# Patient Record
Sex: Male | Born: 1965 | Race: White | Hispanic: No | Marital: Married | Smoking: Never smoker
Health system: Southern US, Academic
[De-identification: ages and names within clinical notes are randomized; demographics above are authoritative.]

## PROBLEM LIST (undated history)

## (undated) DIAGNOSIS — I10 Essential (primary) hypertension: Secondary | ICD-10-CM

## (undated) DIAGNOSIS — K219 Gastro-esophageal reflux disease without esophagitis: Secondary | ICD-10-CM

## (undated) HISTORY — PX: HX GALL BLADDER SURGERY/CHOLE: SHX55

## (undated) HISTORY — DX: Gastro-esophageal reflux disease without esophagitis: K21.9

## (undated) HISTORY — DX: Essential (primary) hypertension: I10

---

## 2004-07-25 ENCOUNTER — Other Ambulatory Visit (HOSPITAL_COMMUNITY): Payer: Self-pay | Admitting: Internal Medicine

## 2021-08-02 ENCOUNTER — Other Ambulatory Visit (HOSPITAL_COMMUNITY): Payer: Self-pay | Admitting: Family Medicine

## 2021-08-02 DIAGNOSIS — R110 Nausea: Secondary | ICD-10-CM

## 2021-08-02 DIAGNOSIS — R109 Unspecified abdominal pain: Secondary | ICD-10-CM

## 2021-08-02 DIAGNOSIS — R1110 Vomiting, unspecified: Secondary | ICD-10-CM

## 2021-08-03 ENCOUNTER — Inpatient Hospital Stay
Admission: RE | Admit: 2021-08-03 | Discharge: 2021-08-03 | Disposition: A | Discharge status: Home / Self Care | Payer: 59 | Source: Ambulatory Visit | Attending: Family Medicine | Admitting: Family Medicine

## 2021-08-03 ENCOUNTER — Other Ambulatory Visit: Payer: Self-pay

## 2021-08-03 ENCOUNTER — Inpatient Hospital Stay (HOSPITAL_COMMUNITY)
Admission: RE | Admit: 2021-08-03 | Discharge: 2021-08-03 | Disposition: A | Discharge status: Home / Self Care | Payer: 59 | Source: Ambulatory Visit

## 2021-08-03 DIAGNOSIS — R112 Nausea with vomiting, unspecified: Secondary | ICD-10-CM

## 2021-08-03 DIAGNOSIS — R109 Unspecified abdominal pain: Secondary | ICD-10-CM

## 2021-08-03 DIAGNOSIS — R110 Nausea: Secondary | ICD-10-CM

## 2021-08-03 DIAGNOSIS — R1110 Vomiting, unspecified: Secondary | ICD-10-CM

## 2021-08-03 MED ORDER — SINCALIDE 5 MCG SOLUTION FOR INJECTION
INTRAMUSCULAR | Status: AC
Start: 2021-08-03 — End: 2021-08-03
  Filled 2021-08-03: qty 5

## 2021-08-03 MED ORDER — SINCALIDE 5 MCG SOLUTION FOR INJECTION
1.7000 ug | INTRAMUSCULAR | Status: DC
Start: 2021-08-03 — End: 2021-08-04

## 2021-08-09 ENCOUNTER — Ambulatory Visit (INDEPENDENT_AMBULATORY_CARE_PROVIDER_SITE_OTHER): Payer: 59 | Admitting: Surgery

## 2021-08-09 ENCOUNTER — Inpatient Hospital Stay
Admission: RE | Admit: 2021-08-09 | Discharge: 2021-08-09 | Disposition: A | Discharge status: Home / Self Care | Payer: 59 | Source: Ambulatory Visit | Attending: Surgery | Admitting: Surgery

## 2021-08-09 ENCOUNTER — Other Ambulatory Visit (INDEPENDENT_AMBULATORY_CARE_PROVIDER_SITE_OTHER): Payer: Self-pay | Admitting: Surgery

## 2021-08-09 ENCOUNTER — Encounter (INDEPENDENT_AMBULATORY_CARE_PROVIDER_SITE_OTHER): Payer: Self-pay | Admitting: Surgery

## 2021-08-09 ENCOUNTER — Other Ambulatory Visit: Payer: Self-pay

## 2021-08-09 ENCOUNTER — Other Ambulatory Visit (HOSPITAL_COMMUNITY): Payer: 59

## 2021-08-09 VITALS — BP 107/67 | HR 58 | Temp 98.2°F | Resp 18 | Ht 68.0 in | Wt 191.0 lb

## 2021-08-09 DIAGNOSIS — Z01818 Encounter for other preprocedural examination: Secondary | ICD-10-CM

## 2021-08-09 DIAGNOSIS — K811 Chronic cholecystitis: Secondary | ICD-10-CM

## 2021-08-09 DIAGNOSIS — K8020 Calculus of gallbladder without cholecystitis without obstruction: Secondary | ICD-10-CM

## 2021-08-09 DIAGNOSIS — R1013 Epigastric pain: Secondary | ICD-10-CM

## 2021-08-09 DIAGNOSIS — K828 Other specified diseases of gallbladder: Secondary | ICD-10-CM

## 2021-08-09 LAB — BASIC METABOLIC PANEL
ANION GAP: 4 mmol/L — ABNORMAL LOW (ref 10–20)
BUN/CREA RATIO: 11 (ref 6–22)
BUN: 11 mg/dL (ref 7–25)
CALCIUM: 9.4 mg/dL (ref 8.6–10.3)
CHLORIDE: 103 mmol/L (ref 98–107)
CO2 TOTAL: 32 mmol/L — ABNORMAL HIGH (ref 21–31)
CREATININE: 0.99 mg/dL (ref 0.60–1.30)
ESTIMATED GFR: 89 mL/min/{1.73_m2} (ref >59)
GLUCOSE: 82 mg/dL (ref 74–109)
OSMOLALITY, CALCULATED: 276 mOsm/kg (ref 270–290)
POTASSIUM: 4.1 mmol/L (ref 3.5–5.1)
SODIUM: 139 mmol/L (ref 136–145)

## 2021-08-09 LAB — CBC WITH DIFF
BASOPHIL #: 0 10*3/uL (ref 0.00–0.30)
BASOPHIL %: 1 % (ref 0–3)
EOSINOPHIL #: 0.2 10*3/uL (ref 0.00–0.80)
EOSINOPHIL %: 5 % (ref 0–7)
HCT: 40.1 % — ABNORMAL LOW (ref 42.0–51.0)
HGB: 13.7 g/dL (ref 13.5–18.0)
LYMPHOCYTE #: 1.3 10*3/uL (ref 1.10–5.00)
LYMPHOCYTE %: 31 % (ref 25–45)
MCH: 28.4 pg (ref 27.0–32.0)
MCHC: 34.3 g/dL (ref 32.0–36.0)
MCV: 83 fL (ref 78.0–99.0)
MONOCYTE #: 0.4 10*3/uL (ref 0.00–1.30)
MONOCYTE %: 10 % (ref 0–12)
MPV: 6.9 fL — ABNORMAL LOW (ref 7.4–10.4)
NEUTROPHIL #: 2.1 10*3/uL (ref 1.80–8.40)
NEUTROPHIL %: 52 % (ref 40–76)
PLATELETS: 197 10*3/uL (ref 140–440)
RBC: 4.83 10*6/uL (ref 4.20–6.00)
RDW: 14 % (ref 11.6–14.8)
WBC: 4.1 10*3/uL (ref 4.0–10.5)
WBCS UNCORRECTED: 4.1 10*3/uL

## 2021-08-10 LAB — ECG 12 LEAD
Atrial Rate: 52 {beats}/min
Calculated P Axis: 69 degrees
Calculated R Axis: 25 degrees
Calculated T Axis: 54 degrees
PR Interval: 152 ms
QRS Duration: 98 ms
QT Interval: 412 ms
QTC Calculation: 383 ms
Ventricular rate: 52 {beats}/min

## 2021-08-11 ENCOUNTER — Ambulatory Visit (HOSPITAL_COMMUNITY): Payer: 59 | Admitting: Anesthesiology

## 2021-08-11 ENCOUNTER — Encounter (HOSPITAL_COMMUNITY)
Admission: RE | Disposition: A | Discharge status: Home / Self Care | Payer: Self-pay | Source: Ambulatory Visit | Attending: Surgery

## 2021-08-11 ENCOUNTER — Encounter (HOSPITAL_COMMUNITY): Payer: Self-pay | Admitting: Surgery

## 2021-08-11 ENCOUNTER — Encounter (INDEPENDENT_AMBULATORY_CARE_PROVIDER_SITE_OTHER): Payer: Self-pay | Admitting: Surgery

## 2021-08-11 ENCOUNTER — Other Ambulatory Visit: Payer: Self-pay

## 2021-08-11 ENCOUNTER — Inpatient Hospital Stay
Admission: RE | Admit: 2021-08-11 | Discharge: 2021-08-11 | Disposition: A | Discharge status: Home / Self Care | Payer: 59 | Source: Ambulatory Visit | Attending: Surgery | Admitting: Surgery

## 2021-08-11 ENCOUNTER — Encounter (HOSPITAL_COMMUNITY): Payer: 59 | Admitting: Surgery

## 2021-08-11 ENCOUNTER — Other Ambulatory Visit (HOSPITAL_COMMUNITY): Payer: 59

## 2021-08-11 DIAGNOSIS — K828 Other specified diseases of gallbladder: Secondary | ICD-10-CM | POA: Insufficient documentation

## 2021-08-11 DIAGNOSIS — K811 Chronic cholecystitis: Secondary | ICD-10-CM | POA: Insufficient documentation

## 2021-08-11 SURGERY — CHOLECYSTECTOMY LAPAROSCOPIC
Anesthesia: General | Site: Abdomen | Wound class: Clean Wound: Uninfected operative wounds in which no inflammation occurred

## 2021-08-11 MED ORDER — MIDAZOLAM 5 MG/ML INJECTION WRAPPER
2.0000 mg | Freq: Once | INTRAMUSCULAR | Status: DC | PRN
Start: 2021-08-11 — End: 2021-08-11
  Administered 2021-08-11: 2 mg via INTRAVENOUS

## 2021-08-11 MED ORDER — ONDANSETRON HCL (PF) 4 MG/2 ML INJECTION SOLUTION
4.0000 mg | Freq: Once | INTRAMUSCULAR | Status: DC | PRN
Start: 2021-08-11 — End: 2021-08-11

## 2021-08-11 MED ORDER — IPRATROPIUM 0.5 MG-ALBUTEROL 3 MG (2.5 MG BASE)/3 ML NEBULIZATION SOLN
3.0000 mL | INHALATION_SOLUTION | Freq: Once | RESPIRATORY_TRACT | Status: DC | PRN
Start: 2021-08-11 — End: 2021-08-11

## 2021-08-11 MED ORDER — ONDANSETRON HCL (PF) 4 MG/2 ML INJECTION SOLUTION
4.0000 mg | Freq: Once | INTRAMUSCULAR | Status: AC
Start: 2021-08-11 — End: 2021-08-11
  Administered 2021-08-11: 4 mg via INTRAVENOUS

## 2021-08-11 MED ORDER — SODIUM CHLORIDE 0.9 % (FLUSH) INJECTION SYRINGE
3.0000 mL | INJECTION | INTRAMUSCULAR | Status: DC | PRN
Start: 2021-08-11 — End: 2021-08-11

## 2021-08-11 MED ORDER — ROCURONIUM 10 MG/ML INTRAVENOUS SYRINGE WRAPPER
INJECTION | Freq: Once | INTRAVENOUS | Status: DC | PRN
Start: 2021-08-11 — End: 2021-08-11
  Administered 2021-08-11: 40 mg via INTRAVENOUS

## 2021-08-11 MED ORDER — FENTANYL (PF) 50 MCG/ML INJECTION SOLUTION
INTRAMUSCULAR | Status: AC
Start: 2021-08-11 — End: 2021-08-11
  Filled 2021-08-11: qty 2

## 2021-08-11 MED ORDER — LACTATED RINGERS INTRAVENOUS SOLUTION
INTRAVENOUS | Status: DC
Start: 2021-08-11 — End: 2021-08-11

## 2021-08-11 MED ORDER — LACTATED RINGERS INTRAVENOUS SOLUTION
INTRAVENOUS | Status: DC | PRN
Start: 2021-08-11 — End: 2021-08-11
  Administered 2021-08-11: 0 via INTRAVENOUS

## 2021-08-11 MED ORDER — ONDANSETRON HCL (PF) 4 MG/2 ML INJECTION SOLUTION
INTRAMUSCULAR | Status: AC
Start: 2021-08-11 — End: 2021-08-11
  Filled 2021-08-11: qty 2

## 2021-08-11 MED ORDER — KETOROLAC 30 MG/ML (1 ML) INJECTION SOLUTION
15.0000 mg | Freq: Four times a day (QID) | INTRAMUSCULAR | Status: DC | PRN
Start: 2021-08-11 — End: 2021-08-11

## 2021-08-11 MED ORDER — PHENYLEPHRINE 100 MCG/ML IV DILUTION - FOR ANES
INJECTION | Freq: Once | INTRAVENOUS | Status: DC | PRN
  Administered 2021-08-11 (×4): 100 ug via INTRAVENOUS

## 2021-08-11 MED ORDER — PROCHLORPERAZINE EDISYLATE 10 MG/2 ML (5 MG/ML) INJECTION SOLUTION
5.0000 mg | Freq: Once | INTRAMUSCULAR | Status: DC | PRN
Start: 2021-08-11 — End: 2021-08-11

## 2021-08-11 MED ORDER — FAMOTIDINE (PF) 20 MG/2 ML INTRAVENOUS SOLUTION
INTRAVENOUS | Status: AC
Start: 2021-08-11 — End: 2021-08-11
  Filled 2021-08-11: qty 2

## 2021-08-11 MED ORDER — SODIUM CHLORIDE 0.9 % (FLUSH) INJECTION SYRINGE
3.0000 mL | INJECTION | Freq: Three times a day (TID) | INTRAMUSCULAR | Status: DC
Start: 2021-08-11 — End: 2021-08-11

## 2021-08-11 MED ORDER — SUGAMMADEX 100 MG/ML INTRAVENOUS SOLUTION
Freq: Once | INTRAVENOUS | Status: DC | PRN
Start: 2021-08-11 — End: 2021-08-11
  Administered 2021-08-11: 300 mg via INTRAVENOUS

## 2021-08-11 MED ORDER — DEXAMETHASONE SODIUM PHOSPHATE 4 MG/ML INJECTION SOLUTION
INTRAMUSCULAR | Status: AC
Start: 2021-08-11 — End: 2021-08-11
  Filled 2021-08-11: qty 1

## 2021-08-11 MED ORDER — OXYCODONE-ACETAMINOPHEN 5 MG-325 MG TABLET
1.0000 | ORAL_TABLET | ORAL | Status: DC | PRN
Start: 2021-08-11 — End: 2021-08-11

## 2021-08-11 MED ORDER — FENTANYL (PF) 50 MCG/ML INJECTION WRAPPER
25.0000 ug | INJECTION | INTRAMUSCULAR | Status: DC | PRN
Start: 2021-08-11 — End: 2021-08-11

## 2021-08-11 MED ORDER — FAMOTIDINE (PF) 20 MG/2 ML INTRAVENOUS SOLUTION
20.0000 mg | Freq: Once | INTRAVENOUS | Status: AC
Start: 2021-08-11 — End: 2021-08-11
  Administered 2021-08-11: 20 mg via INTRAVENOUS

## 2021-08-11 MED ORDER — EPHEDRINE SULFATE 50 MG/ML INTRAVENOUS SOLUTION
Freq: Once | INTRAVENOUS | Status: DC | PRN
Start: 2021-08-11 — End: 2021-08-11
  Administered 2021-08-11: 20 mg via INTRAVENOUS
  Administered 2021-08-11 (×2): 10 mg via INTRAVENOUS

## 2021-08-11 MED ORDER — HYDROCODONE 5 MG-ACETAMINOPHEN 325 MG TABLET
1.0000 | ORAL_TABLET | ORAL | 0 refills | Status: AC | PRN
Start: 2021-08-11 — End: 2021-08-15

## 2021-08-11 MED ORDER — MORPHINE 4 MG/ML INJECTION WRAPPER
4.0000 mg | INJECTION | INTRAMUSCULAR | Status: DC | PRN
Start: 2021-08-11 — End: 2021-08-11

## 2021-08-11 MED ORDER — ALBUTEROL SULFATE 2.5 MG/3 ML (0.083 %) SOLUTION FOR NEBULIZATION
2.5000 mg | INHALATION_SOLUTION | Freq: Once | RESPIRATORY_TRACT | Status: DC | PRN
Start: 2021-08-11 — End: 2021-08-11

## 2021-08-11 MED ORDER — LIDOCAINE (PF) 100 MG/5 ML (2 %) INTRAVENOUS SYRINGE
INJECTION | Freq: Once | INTRAVENOUS | Status: DC | PRN
Start: 2021-08-11 — End: 2021-08-11
  Administered 2021-08-11: 100 mg via INTRAVENOUS

## 2021-08-11 MED ORDER — IBUPROFEN 400 MG TABLET
400.0000 mg | ORAL_TABLET | Freq: Four times a day (QID) | ORAL | Status: DC | PRN
Start: 2021-08-11 — End: 2021-08-11

## 2021-08-11 MED ORDER — PROPOFOL 10 MG/ML IV BOLUS
INJECTION | Freq: Once | INTRAVENOUS | Status: DC | PRN
Start: 2021-08-11 — End: 2021-08-11
  Administered 2021-08-11: 180 mg via INTRAVENOUS

## 2021-08-11 MED ORDER — DEXAMETHASONE SODIUM PHOSPHATE 4 MG/ML INJECTION SOLUTION
4.0000 mg | Freq: Once | INTRAMUSCULAR | Status: AC
Start: 2021-08-11 — End: 2021-08-11
  Administered 2021-08-11: 4 mg via INTRAVENOUS

## 2021-08-11 MED ORDER — ROPIVACAINE (PF) 2 MG/ML (0.2 %) INJECTION SOLUTION
Freq: Once | INTRAMUSCULAR | Status: DC | PRN
Start: 2021-08-11 — End: 2021-08-11
  Administered 2021-08-11: 20 mL via INTRAMUSCULAR

## 2021-08-11 MED ORDER — FENTANYL (PF) 50 MCG/ML INJECTION WRAPPER
INJECTION | Freq: Once | INTRAMUSCULAR | Status: DC | PRN
Start: 2021-08-11 — End: 2021-08-11
  Administered 2021-08-11 (×2): 50 ug via INTRAVENOUS

## 2021-08-11 MED ORDER — FENTANYL (PF) 50 MCG/ML INJECTION WRAPPER
50.0000 ug | INJECTION | INTRAMUSCULAR | Status: AC | PRN
Start: 2021-08-11 — End: 2021-08-11
  Administered 2021-08-11 (×4): 50 ug via INTRAVENOUS

## 2021-08-11 MED ORDER — MIDAZOLAM 5 MG/ML INJECTION WRAPPER
INTRAMUSCULAR | Status: AC
Start: 2021-08-11 — End: 2021-08-11
  Filled 2021-08-11: qty 1

## 2021-08-11 MED ORDER — ONDANSETRON HCL (PF) 4 MG/2 ML INJECTION SOLUTION
4.0000 mg | Freq: Four times a day (QID) | INTRAMUSCULAR | Status: DC | PRN
Start: 2021-08-11 — End: 2021-08-11

## 2021-08-11 MED ORDER — ROPIVACAINE (PF) 2 MG/ML (0.2 %) INJECTION SOLUTION
INTRAMUSCULAR | Status: AC
Start: 2021-08-11 — End: 2021-08-11
  Filled 2021-08-11: qty 10

## 2021-08-11 SURGICAL SUPPLY — 84 items
ADH LIQUID LF  WTPRF VIAL PREP NONSTAIN MASTISOL STYRAX GUM MASTIC ALC MTHY SLCYT STRL CLR NHZR 2/3 (WOUND CARE SUPPLY) ×1 IMPLANT
ADH LQ LF VIAL AMP PREP MASTI_SOL STYRAX GUM MASTIC ALC MTHY (WOUND CARE/ENTEROSTOMAL SUPPLY) ×1
APPLIER E-CLP III SUP INTLK 33CM 5MM PSTL GRIP GLARE RST SAF INTLK HNDL 16 CLIP TI MED LRG INTERNAL (WOUND CARE SUPPLY) ×1 IMPLANT
APPLIER E-CLP III SUP INTLK 33_CM 5MM PSTL GRIP GLARE RST SAF (WOUND CARE/ENTEROSTOMAL SUPPLY) ×1
APPLIER E-CLP2 SUP INTLK 29CM 10MM PSTL GRIP GLARE RST SAF INTLK HNDL 20 ML CLIP TI INTERNAL CLIP (WOUND CARE SUPPLY) ×1 IMPLANT
APPLIER E-CLP2 SUP INTLK 29CM_10MM PSTL GRIP GLARE RST SAF (WOUND CARE/ENTEROSTOMAL SUPPLY) ×1
BAG BIOHAZ RD 30X24IN THK3 MIL C8-10GL LLDPE INFCT WASTE CAN (MED SURG SUPPLIES) ×2
BAG BIOHAZ RD 30X24IN THK3 MIL C8-10GL LLDPE INFCT WASTE CAN PNCT RST (MED SURG SUPPLIES) ×2
BAG BIOHAZ RD 43X30IN THK3 MIL C20-30GL LLDPE INFCT WASTE (MED SURG SUPPLIES) ×1
BAG BIOHAZ RD 43X30IN THK3 MIL C20-30GL LLDPE INFCT WASTE CAN PNCT RST (MED SURG SUPPLIES) ×1 IMPLANT
BLADE 15 2 END CBNSTL SURG STRL DISP (CUTTING ELEMENTS) ×1
BLADE 15 2 END CBNSTL SURG STRL DISP (SURGICAL CUTTING SUPPLIES) ×1 IMPLANT
CATH CHOLANG REDDICK 4FR SCP TIP STRL LTX DISP (VASCULAR) ×1 IMPLANT
CATH CHOLGM SMTR REDDICK 4FR 5_0CM SCP TIP STF TUBE DISP (VASCULAR) ×1
CLEANER INSTR PREPZYME MUL-TRD CONTAINR NARSL NEUT PH BDGR (MISCELLANEOUS PT CARE ITEMS) ×1
CONTAINR HISTO C90ML 10% NEUT BF FRMLN POLYPROP PREFL 60ML (MISCELLANEOUS PT CARE ITEMS) ×2 IMPLANT
CONV USE 102436 - NEEDLE HYPO  22GA 1.5IN STD MONOJECT SS POLYPROP REG BVL LL HUB UL SHRP ANTICORE BLU STRL LF  DISP (MED SURG SUPPLIES) ×2 IMPLANT
CONV USE 146831 - TROCAR LAPSCP 100MM 11MM KII FIOS Z THREAD SLEEVE 1ST ENTRY STRL LF  ACCESS SYS ABDOMINAL (ENDOSCOPIC SUPPLIES) ×1 IMPLANT
CONV USE ITEM 321837 - GLOVE SURG 7.5 LTX PF NONST CRM (GLOVES AND ACCESSORIES) ×1 IMPLANT
CONV USE ITEM 321852 - GLOVE SURG 6.5 LF  PLISPRN (GLOVES AND ACCESSORIES) ×2 IMPLANT
CONV USE ITEM 323185 - PAD EG 15SQ IN UNIV FOAM SPLT NONCORD ADULT 9100 SER (SURGICAL CUTTING SUPPLIES) ×1 IMPLANT
CONV USE ITEM 329146 - CLEANER INSTR PREPZYME MUL-TRD CONTAINR NARSL NEUT PH BDGR 22OZ (MISCELLANEOUS PT CARE ITEMS) ×1 IMPLANT
CONV USE ITEM 45435 - STRIP SKNCLS MDSTRP FBR NYL POR MED 4X.5IN ADH HYPOALL REINF (SUTURE/WOUND CLOSURE) ×1 IMPLANT
CONV USE ITEM 49641 - TROCAR LAPSCP 100MM 5MM KII Z THREAD SLEEVE SHIELD BLADE STRL LF  ACCESS SYS ABDOMINAL (ENDOSCOPIC SUPPLIES) ×1 IMPLANT
CONV USE ITEM 81225 - BAG BIOHAZ RD 30X24IN THK3 MIL C8-10GL LLDPE INFCT WASTE CAN PNCT RST (MED SURG SUPPLIES) ×2 IMPLANT
CONV USE ITEM 81996 - SLEEVE LAPSCP 5MM OPTC ACCESS SYS 100MM KII ABDOMINAL Z THREAD CANN SEAL STRL LF (ENDOSCOPIC SUPPLIES) ×2 IMPLANT
CONV USE ITEM 91385 - SUTURE 0 GS-22 POLYSRB 30IN VIOL BRD COAT ABS (SUTURE/WOUND CLOSURE) ×1 IMPLANT
COUNTER 20 CNT BLOCK ADH NEEDLE STRL LF  RD SHARP FOAM 15.75X11.5X14IN DISP (MED SURG SUPPLIES) ×1 IMPLANT
COUNTER 20 CNT BLOCK ADH NEEDLE STRL LF RD SHARP FOAM 15.75 (MED SURG SUPPLIES) ×1
COVER 53X24IN MAYOSTAND PRXM STRL DISP EQP SMS LF (DRAPE/PACKS/SHEETS/OR TOWEL) ×1 IMPLANT
COVER TBL 90X50IN STD SMS REINF FNFLD STRL LF  DISP (DRAPE/PACKS/SHEETS/OR TOWEL) ×2 IMPLANT
COVER TBL 90X50IN STD SMS REINF FNFLD STRL LF DISP (DRAPE/PACKS/SHEETS/OR TOWEL) ×2
DRAPE ABS FENESTRATE ADH 121X102X77IN ABDOMINAL 12IN 13IN PRXM LF  STRL DISP SURG SMS 20X36IN (DRAPE/PACKS/SHEETS/OR TOWEL) ×1 IMPLANT
DRAPE ABS FENESTRATE ADH 121X1_02X77IN ABDOMINAL 12IN 13IN (DRAPE/PACKS/SHEETS/OR TOWEL) ×1
DRAPE MAYOSTAND CVR 53X24IN PR_XM LF STRL DISP EQP SMS (DRAPE/PACKS/SHEETS/OR TOWEL) ×1
GLOVE SURG 6.5 LF PF SMOOTH STRL WHT PLISPRN (GLOVES AND ACCESSORIES) ×2
GLOVE SURG 6.5 LTX PF BEAD CUF MICRO ROUGHEN N-PYRG STRL STRW BGL SRG CURVE FINGER (GLOVES AND ACCESSORIES) ×1 IMPLANT
GLOVE SURG 6.5 LTX PF BEAD CUF_MICRO RGH N-PYRG STRL STRW (GLOVES AND ACCESSORIES) ×1
GLOVE SURG 7 LF  PF STRL PLISPRN DISP (GLOVES AND ACCESSORIES) ×1 IMPLANT
GLOVE SURG 7 LF PF SMOOTH STRL WHT PLISPRN (GLOVES AND ACCESSORIES) ×1
GLOVE SURG 7.5 LTX PF SMOOTH STRL CRM (GLOVES AND ACCESSORIES) ×1
GOWN SURG LRG STD LGTH REG L3 NONREINFORCE BRTHBL TWL STRL (DRAPE/PACKS/SHEETS/OR TOWEL) ×1
GOWN SURG LRG STD LGTH REG L3 NONREINFORCE BRTHBL TWL STRL LF  DISP BLU HALYARD SPECTRUM SMS (DRAPE/PACKS/SHEETS/OR TOWEL) ×1 IMPLANT
GOWN SURG XL STD LGTH L3 HKLP CLSR RGLN SLEEVE TWL STRL LF (DRAPE/PACKS/SHEETS/OR TOWEL) ×2
GOWN SURG XL STD LGTH L3 HKLP CLSR RGLN SLEEVE TWL STRL LF  DISP GRN AERO BLU PRFRM FBRC (DRAPE/PACKS/SHEETS/OR TOWEL) ×2 IMPLANT
GOWN SURG XL STD LGTH L3 NONREINFORCE HKLP CLSR TWL STRL LF (DRAPE/PACKS/SHEETS/OR TOWEL) ×2
GOWN SURG XL STD LGTH L3 NONREINFORCE HKLP CLSR TWL STRL LF  DISP BLU SPECTRUM SMS (DRAPE/PACKS/SHEETS/OR TOWEL) ×2
GOWN SURG XL STD LGTH L3 NONREINFORCE HKLP CLSR TWL STRL LF DISP BLU SPECTRUM SMS (DRAPE/PACKS/SHEETS/OR TOWEL) ×2 IMPLANT
HDPE THK22 UM C40-45 GL L48 IN X W40 IN NATURAL (MISCELLANEOUS PT CARE ITEMS) ×2 IMPLANT
IRR SUCT 10FT STRKFL TUBE 2 SPIKE STRL LF  DISP (ENDOSCOPIC SUPPLIES) ×1 IMPLANT
IRR SUCT 10FT STRKFL TUBE 2 SPIKE STRL LF DISP (INSTRUMENTS ENDOMECHANICAL) ×1
LABEL MED CORRECT MED LABELING SYS 4 FLG 2 SHEET 24 PRPRNT (MED SURG SUPPLIES) ×1
LABEL MED CORRECT MED LABELING SYS 4 FLG 2 SHEET 24 PRPRNT STRL (MED SURG SUPPLIES) ×1 IMPLANT
LINER SUCT MEDIVAC CRD TW LOCK LID SHTOF VALVE CAN PORT 3L LF  DISP (MED SURG SUPPLIES) ×1 IMPLANT
LINER SUCT MEDIVAC CRD TW LOCK_LID SHTOF VALVE CAN PORT 3L (MED SURG SUPPLIES) ×1
NEEDLE HYPO 22GA 1.5IN STD MONOJECT SS POLYPROP REG BVL LL (MED SURG SUPPLIES) ×2
OMNIPAQUE POLYMER 50ML 350MG BOTTLE 50 ML (MEDICATIONS/SOLUTIONS) ×2 IMPLANT
PAD EG 15SQ IN UNIV FOAM SPLT NONCORD ADULT 9100 SER (CUTTING ELEMENTS) ×1
SET TUBING PNEUMOCLEAR HIFLO SMOKE EVAC (MED SURG SUPPLIES) ×1 IMPLANT
SET TUBING PNEUMOCLEAR HIFLO S_MOKE EVAC DEMOTE (MED SURG SUPPLIES) ×1
SLEEVE COMPRESS MED KNEE LGTH KENDALL SCD SEQ NONST LF  DISP 21- IN DVT PE (MED SURG SUPPLIES) ×1 IMPLANT
SLEEVE COMPRESS MED KNEE LGTH KENDALL SCD SEQ NONST LF DISP (MED SURG SUPPLIES) ×1
SOL ANFG DFGR ISOPRPNL PAD OVAL BTL NABRSV ADH STRL LF  DISP (ENDOSCOPIC SUPPLIES) ×1 IMPLANT
SOL IRRG 0.9% NACL 1000ML PLASTIC PR BTL PRSV FR DEHP-FR AQLT LF (MEDICATIONS/SOLUTIONS) ×1 IMPLANT
SOL IRRG 0.9% NACL 1000ML PRSV FR DEHP-FR STRL AQLT LF (MEDICATIONS/SOLUTIONS) ×1
SOL IV LR 1000ML N-PYRG FLXB CONTAINR STRL LF (MEDICATIONS/SOLUTIONS) ×1
SOL IV LR 1000ML PRSV FR FLXB CONTAINR LF (MEDICATIONS/SOLUTIONS) ×1 IMPLANT
SOLUTION ANTI FOG W/SPONGE_280101 DEFOG ENDOMATE 20EA/CS (INSTRUMENTS ENDOMECHANICAL) ×1
SPONGE SURG 4X4IN 16 PLY XRY DETECT COTTON STRL LF  DISP (WOUND CARE SUPPLY) ×1 IMPLANT
SPONGE SURG 4X4IN 16 PLY_RADOPQ COT STRL LF DISP (WOUND CARE/ENTEROSTOMAL SUPPLY) ×2
STRIP SKNCLS MDSTRP FBR NYL POR MED 4X.5IN ADH HYPOALL REINF (SUTURE/WOUND CLOSURE) ×1
SUTURE 0 GS-22 POLYSRB 30IN VIOL BRD COAT ABS (SUTURE/WOUND CLOSURE) ×1
SUTURE 4-0 C-13 BIOSYN 30IN UNDYED MONOF ABS (SUTURE/WOUND CLOSURE) ×4 IMPLANT
SYRINGE LL 10ML LF  STRL GRAD N-PYRG DEHP-FR PVC FREE MED DISP (MED SURG SUPPLIES) ×4 IMPLANT
SYRINGE LL 10ML LF STRL MED D_ISP (MED SURG SUPPLIES) ×4
TOWEL 24X16IN COTTON BLU DISP SURG STRL LF (DRAPE/PACKS/SHEETS/OR TOWEL) ×4 IMPLANT
TROCAR 11MM BLADELESS Z (INSTRUMENTS ENDOMECHANICAL) ×1
TROCAR 5MM Z SLEEVE (INSTRUMENTS ENDOMECHANICAL) ×2
TROCAR HERNIA BAL BLUNT 10MM_OMST10BT 5EA/BX (INSTRUMENTS ENDOMECHANICAL) ×1
TROCAR LAPSCP 100MM 11MM KII FIOS Z THREAD SLEEVE 1ST ENTRY STRL LF  ACCESS SYS ABDOMINAL (ENDOSCOPIC SUPPLIES) ×1
TROCAR LAPSCP 100MM 5MM KII Z THREAD SLEEVE SHIELD BLADE (INSTRUMENTS ENDOMECHANICAL) ×1
TROCAR LAPSCP 5MM 7/8MM 10MM ATSUT BLUNT TIP BUIL IN CNVTR BAL OBTURATOR STRL LTX DISP (ENDOSCOPIC SUPPLIES) ×1 IMPLANT
TUBE BUBBLE CONNECTING_8888280214 1EA/BX/CS (MED SURG SUPPLIES) ×1
TUBING SUCT CLR 100FT 3/16IN ARGYLE UNIV PVC NCDTV BBL NONST LF (MED SURG SUPPLIES) ×1 IMPLANT

## 2021-08-11 NOTE — H&P (Signed)
Office History and Physical      Reason for Visit: Gall Bladder (Gallbladder, EF 6% on HIDA)    History of Present Illness  Mr. Timothy Mcpherson presents as a referral by Jacalyn Lefevre, MD for evaluation of abnormal HIDA scan with an ejection fraction of 6%.  (acute new with unknown prognosis) Previous gallbladder ultrasound unremarkable for gallstones.  Gives a history consistent with biliary disease with classic right upper quadrant abdominal pain with radiation to the back which was postprandial.  Symptoms are different from history of reflux    I have reviewed the patient's provided medical records and diagnostic testing including laboratory values, imaging results, documented encounters and providers notes with all pertinent information noted with respect to today's evaluation serving as unique tests and sources as a component of the medical decision making process for this encounter relevant to the patients independent evaluation by me today.        Patient Data  Patient History  Past Medical History:   Diagnosis Date   . Esophageal reflux    . Hypertension          Past surgical history is unremarkable      No current outpatient medications on file.     No Known Allergies  Family Medical History:    None         Social History     Tobacco Use   . Smoking status: Never   . Smokeless tobacco: Never   Vaping Use   . Vaping Use: Never used   Substance Use Topics   . Alcohol use: Not Currently   . Drug use: Not Currently        The above documented section regarding past medical, past surgical, family, and social history (PMFSH) has been reviewed and considered and to the best of my knowledge represents a valid and accurate reflection of the patient's previous pertinent experiences documented by multiple providers and participants of the EMR.I cannot attest to all entries but do no recognize any gross inaccuracies as the data is a common field across all providers  Further history pertinent to the current encounter  will be found as referenced       Physical Examination:    Vitals:    08/09/21 1613   BP: 107/67   Pulse: 58   Resp: 18   Temp: 36.8 C (98.2 F)   SpO2: 97%   Weight: 86.6 kg (191 lb)   Height: 1.727 m (5\' 8" )   BMI: 29.1      General: appropriate for age. in no acute distress.    HEENT: Atraumatic, Normocephalic.    Lungs: Nonlabored breathing with symmetric expansion    Heart:Regular wth respect to rate and rythmn.    Abdomen:Soft. Nontender. Nondistended     Psychiatric: Alert and oriented to person, place, and time. affect appropriate    Skin: No rashes or obvious skin lesions         Diagnosis:    ICD-10-CM    1. Epigastric pain  R10.13       2. Biliary dyskinesia  K82.8       3. Chronic cholecystitis  K81.1           Plan:    Risks, benefits, indications, and complications of laparoscopic possible open cholecystectomy were discussed in detail including but not limited to conversion to open procedure, common bile duct injury, bleeding, retained stone, bile leak, postoperative hernia, continued pain, postoperative diarrhea, reaction to anesthesia, injury to associated or  surrounding organs, wound infection, and remote possibility of death.  All questions were answered and the patient voices understanding of the procedure and wishes to proceed with surgery.  Informed consent clearly obtained.  Also discussed the diagnosis of biliary dyskinesia including expected improvement in symptoms with  a decreased ejection fraction.  Stressed that recurrence of similar symptoms may be present in later years with also risk of persistent pain despite surgery.  All questions were answered regarding the diagnosis of biliary dyskinesia.        This note may have been partially generated using MModal Fluency Direct system, and there may be some incorrect words, spellings, and punctuation that were not noted in checking the note before saving, though effort was made to avoid such errors.    Clide Dales MD FACS RVT  [facility] -General Surgery

## 2021-08-11 NOTE — Anesthesia Transfer of Care (Signed)
ANESTHESIA TRANSFER OF CARE   Timothy Mcpherson is a 56 y.o. ,male, Weight: 86.2 kg (190 lb)   had Procedure(s):  LAPAROSCOPIC CHOLECYSTECTOMY WITH INTRAOPERATIVE CHOLANGIOGRAM USING FLUOROSCOPY  performed  08/11/21   Primary Service: Reinaldo Meeker, MD    Past Medical History:   Diagnosis Date   . Esophageal reflux    . Hypertension       Allergy History as of 08/11/21      No Known Allergies              I completed my transfer of care / handoff to the receiving personnel during which we discussed:  All key/critical aspects of case discussed, Analgesia, Fluids/Product and PMHx    Post Location: PACU                                       Report given to: Hetty Ely, RN                           Last OR Temp: Temperature: 36.1 C (97 F)  ABG:  POTASSIUM   Date Value Ref Range Status   08/09/2021 4.1 3.5 - 5.1 mmol/L Final     CALCIUM   Date Value Ref Range Status   08/09/2021 9.4 8.6 - 10.3 mg/dL Final     Calculated P Axis   Date Value Ref Range Status   08/09/2021 69 degrees Final     Calculated R Axis   Date Value Ref Range Status   08/09/2021 25 degrees Final     Calculated T Axis   Date Value Ref Range Status   08/09/2021 54 degrees Final     Airway:* No LDAs found *  Blood pressure 94/69, pulse 80, temperature 36.1 C (97 F), resp. rate 17, height 1.727 m (5\' 8" ), weight 86.2 kg (190 lb), SpO2 98 %.

## 2021-08-11 NOTE — Discharge Instructions (Signed)
Follow up with Dr Georgiann Cocker on May 23 @ 9 am    Do not remove steri strips.  They will come off on their own..    Daily showers.    No lifting, heaving, or straining greater than 20 pounds.    Gas X over the counter for gas discomfort    Call for problems, questions, concerns.    Resume home meds.    Rx for home sent in to your pharmacy.

## 2021-08-11 NOTE — Anesthesia Preprocedure Evaluation (Signed)
ANESTHESIA PRE-OP EVALUATION  Planned Procedure: LAPAROSCOPIC CHOLECYSTECTOMY WITH INTRAOPERATIVE CHOLANGIOGRAM USING FLUOROSCOPY; POSSIBLE OPEN (Abdomen)  Review of Systems     anesthesia history negative     patient summary reviewed  nursing notes reviewed        Pulmonary  negative pulmonary ROS,    Cardiovascular    Hypertension, well controlled and ECG reviewed ,No peripheral edema,  Exercise Tolerance: > or = 4 METS        GI/Hepatic/Renal    GERD and well controlled        Endo/Other   neg endo/other ROS,       Neuro/Psych/MS   negative neuro/psych ROS,      Cancer    negative hematology/oncology ROS,                   Physical Assessment      Airway       Mallampati: II    TM distance: >3 FB    Neck ROM: full  Mouth Opening: good.    Vona       Dentition intact             Pulmonary    Breath sounds clear to auscultation  (-) no rhonchi, no decreased breath sounds, no wheezes, no rales and no stridor     Cardiovascular    Rhythm: regular  Rate: Normal  (-) no friction rub, carotid bruit is not present, no peripheral edema and no murmur     Other findings            Plan  ASA 2     Planned anesthesia type: general     general anesthesia with endotracheal tube intubation                PONV/POV Plan:  I plan to administer pharmcologic prophalaxis antiemetics  Intravenous induction     Anesthesia issues/risks discussed are: Dental Injuries and Sore Throat.  Anesthetic plan and risks discussed with patient  Signed consent obtained            Patient's NPO status is appropriate for Anesthesia.           Plan discussed with CRNA.

## 2021-08-11 NOTE — Interval H&P Note (Signed)
[facility]      H&P UPDATE FORM                                                                                  [place], [place], 56 y.o. male  Date of Admission:  08/11/2021  Date of Birth:  05-09-1965    08/11/2021    STOP: IF H&P IS GREATER THAN 30 DAYS FROM SURGICAL DAY COMPLETE NEW H&P IS REQUIRED.     H & P updated the day of the procedure.  1.  H&P completed within 30 days of surgical procedure and has been reviewed within 24 hours of admission but prior to surgery or a procedure requiring anesthesia services, the patient has been examined, and no change has occured in the patients condition since the H&P was completed.       Change in medications: No              Comments:     2.  Patient continues to be appropriate candidate for planned surgical procedure. YES    Reinaldo Meeker, MD

## 2021-08-11 NOTE — H&P (View-Only) (Signed)
Office History and Physical      Reason for Visit: Gall Bladder (Gallbladder, EF 6% on HIDA)    History of Present Illness  Timothy Mcpherson presents as a referral by Jacalyn Lefevre, MD for evaluation of abnormal HIDA scan with an ejection fraction of 6%.  (acute new with unknown prognosis) Previous gallbladder ultrasound unremarkable for gallstones.  Gives a history consistent with biliary disease with classic right upper quadrant abdominal pain with radiation to the back which was postprandial.  Symptoms are different from history of reflux    I have reviewed the patient's provided medical records and diagnostic testing including laboratory values, imaging results, documented encounters and providers notes with all pertinent information noted with respect to today's evaluation serving as unique tests and sources as a component of the medical decision making process for this encounter relevant to the patients independent evaluation by me today.        Patient Data  Patient History  Past Medical History:   Diagnosis Date   . Esophageal reflux    . Hypertension          Past surgical history is unremarkable      No current outpatient medications on file.     No Known Allergies  Family Medical History:    None         Social History     Tobacco Use   . Smoking status: Never   . Smokeless tobacco: Never   Vaping Use   . Vaping Use: Never used   Substance Use Topics   . Alcohol use: Not Currently   . Drug use: Not Currently        The above documented section regarding past medical, past surgical, family, and social history (PMFSH) has been reviewed and considered and to the best of my knowledge represents a valid and accurate reflection of the patient's previous pertinent experiences documented by multiple providers and participants of the EMR.I cannot attest to all entries but do no recognize any gross inaccuracies as the data is a common field across all providers  Further history pertinent to the current encounter  will be found as referenced       Physical Examination:    Vitals:    08/09/21 1613   BP: 107/67   Pulse: 58   Resp: 18   Temp: 36.8 C (98.2 F)   SpO2: 97%   Weight: 86.6 kg (191 lb)   Height: 1.727 m (5\' 8" )   BMI: 29.1      General: appropriate for age. in no acute distress.    HEENT: Atraumatic, Normocephalic.    Lungs: Nonlabored breathing with symmetric expansion    Heart:Regular wth respect to rate and rythmn.    Abdomen:Soft. Nontender. Nondistended     Psychiatric: Alert and oriented to person, place, and time. affect appropriate    Skin: No rashes or obvious skin lesions         Diagnosis:    ICD-10-CM    1. Epigastric pain  R10.13       2. Biliary dyskinesia  K82.8       3. Chronic cholecystitis  K81.1           Plan:    Risks, benefits, indications, and complications of laparoscopic possible open cholecystectomy were discussed in detail including but not limited to conversion to open procedure, common bile duct injury, bleeding, retained stone, bile leak, postoperative hernia, continued pain, postoperative diarrhea, reaction to anesthesia, injury to associated or  surrounding organs, wound infection, and remote possibility of death.  All questions were answered and the patient voices understanding of the procedure and wishes to proceed with surgery.  Informed consent clearly obtained.  Also discussed the diagnosis of biliary dyskinesia including expected improvement in symptoms with  a decreased ejection fraction.  Stressed that recurrence of similar symptoms may be present in later years with also risk of persistent pain despite surgery.  All questions were answered regarding the diagnosis of biliary dyskinesia.        This note may have been partially generated using MModal Fluency Direct system, and there may be some incorrect words, spellings, and punctuation that were not noted in checking the note before saving, though effort was made to avoid such errors.    Clide Dales MD FACS RVT  [facility] -General Surgery

## 2021-08-11 NOTE — OR Surgeon (Signed)
[facility]    OPERATIVE NOTE    Patient Name: Timothy Mcpherson, Timothy Mcpherson MRN:: [id number]  Date of Birth: 04/02/1966  Date of Service: 08/11/2021     Pre-Operative Diagnosis:  Biliary dyskinesia with chronic cholecystitis    Post-Operative Diagnosis:  Biliary dyskinesia with chronic cholecystitis    Procedure(s)/Description:  LAPAROSCOPIC CHOLECYSTECTOMY WITH INTRAOPERATIVE CHOLANGIOGRAM USING FLUOROSCOPY:      Attending Surgeon: Clide Dales, MD     Assistant Surgeon:  Esmond Plants MD, FACS    Anesthesia Staff:  Anesthesiologist: Luisa Hart Mayme Genta, MD  CRNA: Magdalene Patricia, CRNA    Anesthesia Type: .General     Estimated Blood Loss:  Minimal    Specimens Removed:   ID Type Source Tests Collected by Time Destination   1 :  Tissue Gallbladder SURGICAL PATHOLOGY SPECIMEN Clide Dales, MD 08/11/2021 0820       Order Name Source Comment Collection Info Order Time   SURGICAL PATHOLOGY SPECIMEN Gallbladder Pre-op diagnosis:  GALLSTONES Collected By: Clide Dales, MD 08/11/2021  8:21 AM     Release to patient   Automated             Complications:  None    Condition:  Stable    Intraoperative Findings:     The patient's abdomen was approached for laparoscopic examination where the right upper quadrant inspected with findings of chronic cholecystitis.  The gallbladder was elevated and laterally retracted to allow exposure of Calot's triangle with a critical view accomplished.  Intraoperative cholangiogram was performed with findings of free flow of contrast in to the duodenum, normal-appearing hepatic radicals, and no evidence of filling defect.  The remainder of laparoscopic examination, although limited was unremarkable.     Description of Procedure     The patient was brought to the operating suite and placed in the supine position on the operating table where anesthesia services provided IV sedation followed by endotracheal intubation and general anesthesia.  After induction of general  anesthesia the patient's abdomen was prepped and draped in the usual sterile fashion in anticipation of laparoscopic, possible open cholecystectomy.     Attention was first directed toward the patient's umbilicus where the skin and subcutaneous tissue was injected with local anesthesia followed by a sharp incision through a small infraumbilical skin incision without difficulty.  The subcutaneous tissue was divided through the use of an S retractor to allow exposure of the abdominal wall fascia.  The abdominal wall fascia was grasped, elevated and sharply incised with access to the peritoneal cavity accomplished through this open technique without difficulty.  A Blunt balloon tipped 10 mm trocar was then placed through this fascial defect with institution of a pneumoperitoneum with appropriate intraabdominal pressure identified.  The patient was then placed in reverse Trendelenburg and rotated to the left where the surgeon's subxiphoid and assistant's two lateral trocar ports were placed under direct visualization after local analgesia had been accomplished.     After access to the right upper quadrant was obtained the gallbladder was identified at its normal anatomic location where the dome of the gallbladder was grasped and elevated over the edge of the liver into the right upper quadrant where the infundibulum was laterally retracted to optimally expose the cholecystic junction.  Through the surgeon's working port the peritoneal attachments over the cystic duct was taken down through the use of blunt dissection to isolate the cystic duct.  More medial dissection was accomplished to allow complete dissection of the cystic  artery followed by identification of all borders of Calot's triangle by both medially and laterally rotating the infundibulum to allow optimal exposure with a critical view obtained.  A clip applier was then placed at the cholecystic junction followed by a small incision in the anterior aspect of  the cystic duct and performance of an intraoperative cholangiogram with findings found on the dictated op. report above.        After completion of the cholangiogram the cystic duct was ligated x3 clips distally followed by sharp transection between the clips. The cystic artery was then in similar fashion  isolated, ligated and transected to ensure hemostasis  The remnant cystic duct and cystic artery were noted to be in the inferior aspect of the surgical dissection completely ligated. The gallbladder was then mobilized from the liver bed through the use of short bursts of electrocautery with removal of the entire gallbladder without difficulty.  The gallbladder was removed through the umbilical defect followed by inspection of the right upper quadrant which was noted to be hemostatic.  The pneumoperitoneum was then resolved followed by closure of the umbilical defect through the use of heavy Vicryl suture in a figure-of-eight fashion followed by closure of the skin incisions with Monofilament absorbable suture in a subcuticular fashion at all skin incisions.  The skin incisions were then Steri-Stripped perpendicular to the axis of the  incisions followed by application of a sterile dressing.     The patient tolerated the procedure well and was returned to the postanesthesia care unit in a stable condition after extubation in the operating suite.  Prior to closure all sponge, instrument, and needle counts were correct and wound hemostasis was excellent.    The assistant surgeon was present and an active participant in all key portions of the procedure with confirmation of presence as documented in the hospital intraoperative completed record.  This included but was not limited to access to the abdomen, trocar placement, optimal exposure of all critical elements of Calot's triangle, dissection of the gallbladder from the liver bed itself, intraoperative decision making, and closure of the abdomen.        Clide Dales MD FACS RVT  [facility] -General Surgery

## 2021-08-11 NOTE — Anesthesia Postprocedure Evaluation (Signed)
Anesthesia Post Op Evaluation    Patient: Timothy Mcpherson  Procedure(s):  LAPAROSCOPIC CHOLECYSTECTOMY WITH INTRAOPERATIVE CHOLANGIOGRAM USING FLUOROSCOPY    Last Vitals:Temperature: 36.1 C (97 F) (08/11/21 0841)  Heart Rate: 64 (08/11/21 0910)  BP (Non-Invasive): (!) 141/73 (08/11/21 0910)  Respiratory Rate: 20 (08/11/21 0910)  SpO2: 100 % (08/11/21 0910)    No notable events documented.    Patient is sufficiently recovered from the effects of anesthesia to participate in the evaluation and has returned to their pre-procedure level.  Patient location during evaluation: PACU       Patient participation: complete - patient participated  Level of consciousness: awake and alert and responsive to verbal stimuli    Pain management: adequate  Airway patency: patent    Anesthetic complications: no  Cardiovascular status: acceptable  Respiratory status: acceptable  Hydration status: acceptable  Patient post-procedure temperature: Pt Normothermic   PONV Status: Absent

## 2021-08-14 ENCOUNTER — Encounter (INDEPENDENT_AMBULATORY_CARE_PROVIDER_SITE_OTHER): Payer: Self-pay | Admitting: Surgery

## 2021-08-14 ENCOUNTER — Telehealth (INDEPENDENT_AMBULATORY_CARE_PROVIDER_SITE_OTHER): Payer: Self-pay | Admitting: Surgery

## 2021-08-14 DIAGNOSIS — K811 Chronic cholecystitis: Secondary | ICD-10-CM

## 2021-08-14 LAB — SURGICAL PATHOLOGY SPECIMEN

## 2021-08-14 NOTE — Nursing Note (Signed)
Tried to reach patient today in regards to post operative follow up care, no answer left a message to return our call.

## 2021-08-29 ENCOUNTER — Ambulatory Visit (INDEPENDENT_AMBULATORY_CARE_PROVIDER_SITE_OTHER): Payer: 59 | Admitting: NURSE PRACTITIONER

## 2021-08-29 ENCOUNTER — Other Ambulatory Visit: Payer: Self-pay

## 2021-08-29 ENCOUNTER — Encounter (INDEPENDENT_AMBULATORY_CARE_PROVIDER_SITE_OTHER): Payer: Self-pay | Admitting: NURSE PRACTITIONER

## 2021-08-29 VITALS — BP 109/72 | HR 65 | Temp 98.4°F | Ht 68.0 in | Wt 186.8 lb

## 2021-08-29 DIAGNOSIS — Z09 Encounter for follow-up examination after completed treatment for conditions other than malignant neoplasm: Secondary | ICD-10-CM

## 2021-08-29 DIAGNOSIS — Z9889 Other specified postprocedural states: Secondary | ICD-10-CM

## 2021-08-29 NOTE — Progress Notes (Signed)
GENERAL SURGERY, [facility]  [address]  [place] [address]       Name: Timothy Mcpherson MRN:  [id number]   Date: 08/29/2021 Age: 56 y.o. Aug 07, 1965      PCP: Jacalyn Lefevre, MD     Subjective  Timothy Mcpherson is a 56 y.o. year old male who presents for follow up of laparoscopic cholecystectomy on 08/11/2021.  Patient pleased with his postoperative progress at this point.  Has had appropriate incisional discomfort.      He does report an approximate 30-32 lb weight loss over the last year.  Says that he has felt like the gallbladder issue has contributed to the weight loss, but is concerned whether any additional testing needs to be done.  In review of chart, he had a colonoscopy done 07/05/20 with findings of internal hemorrhoids, sigmoid diverticulosis, redundant colon, and colonic polyp.      There are no problems to display for this patient.       Current Outpatient Medications   Medication Sig   . aspirin 81 mg Oral Tablet, Chewable Chew 1 Tablet (81 mg total) Once a day   . diphenhydrAMINE (BENADRYL) 25 mg Oral Capsule Take 1 Capsule (25 mg total) by mouth Every evening   . ferrous sulfate (FERATAB) 324 mg (65 mg iron) Oral Tablet, Delayed Release (E.C.) Take 1 Tablet (324 mg total) by mouth Every 7 days   . fluticasone propionate (FLONASE) 50 mcg/actuation Nasal Spray, Suspension Administer 1 Spray into each nostril Twice daily   . lisinopriL-hydrochlorothiazide (ZESTORETIC) 10-12.5 mg Oral Tablet Take 1 Tablet by mouth Every 48 hours   . montelukast (SINGULAIR) 10 mg Oral Tablet Take 1 Tablet (10 mg total) by mouth Every evening   . omeprazole (PRILOSEC) 40 mg Oral Capsule, Delayed Release(E.C.) Take 1 Capsule (40 mg total) by mouth Once a day   . rosuvastatin (CRESTOR) 20 mg Oral Tablet Take 1 Tablet (20 mg total) by mouth Once a day          Objective: BP 109/72 (Site: Left, Patient Position: Sitting)   Pulse 65   Temp 36.9 C (98.4 F)   Ht 1.727 m (5\' 8" )   Wt  84.7 kg (186 lb 12.8 oz)   SpO2 98%   BMI 28.40 kg/m          General:appropriate for age. in no acute distress.    Vital signs are present above and have been reviewed by me     GI:  Well-healed without any apparent complication.  No erythema or expressible drainage.  Good cosmetic appearance.    Assessment/Plan  Assessment/Plan   1. Postop check        Patient is pleased with the results.  Preoperative symptoms have resolved.  I would be happy to see again at any time.  Opportunity was given for questions and none were voiced past the above discussion.  Patient is free to return at any time for further difficulties and/or questions. Pathology reviewed.    Colonoscopy was done within the last year.  No need for additional colonoscopy at this time.     , FNP

## 2021-09-28 ENCOUNTER — Encounter (INDEPENDENT_AMBULATORY_CARE_PROVIDER_SITE_OTHER): Payer: Self-pay | Admitting: Surgery

## 2021-09-28 ENCOUNTER — Other Ambulatory Visit: Payer: Self-pay

## 2021-09-28 ENCOUNTER — Ambulatory Visit (INDEPENDENT_AMBULATORY_CARE_PROVIDER_SITE_OTHER): Payer: 59 | Admitting: Surgery

## 2021-09-28 VITALS — BP 132/76 | HR 60 | Temp 96.5°F | Resp 18 | Ht 68.0 in | Wt 190.0 lb

## 2021-09-28 DIAGNOSIS — M79604 Pain in right leg: Secondary | ICD-10-CM

## 2021-09-28 DIAGNOSIS — Z9889 Other specified postprocedural states: Secondary | ICD-10-CM

## 2021-09-28 DIAGNOSIS — K811 Chronic cholecystitis: Secondary | ICD-10-CM

## 2021-09-28 DIAGNOSIS — M79605 Pain in left leg: Secondary | ICD-10-CM

## 2021-10-13 ENCOUNTER — Encounter (INDEPENDENT_AMBULATORY_CARE_PROVIDER_SITE_OTHER): Payer: Self-pay | Admitting: Surgery

## 2021-10-13 NOTE — Progress Notes (Signed)
Office visit      Reason for Visit: Post Op (6 week follow up lap chole s/p 08/11/21)    History of Present Illness  Timothy Mcpherson presents  for Follow up of cholecystectomy. Preoperative symptoms have completely resolved. Now complains mostly of lower extremity discomfort with documented findings of " weak pulse" by his primary care provider. Cardiovascular risk factors are minimal. No history smoking. Does have hypertension. No hypercholesterolemia. No nonhealing wounds.      I have reviewed the patient's provided medical records and diagnostic testing including laboratory values, imaging results, documented encounters and providers notes with all pertinent information noted with respect to today's evaluation serving as unique tests and sources as a component of the medical decision making process for this encounter relevant to the patients independent evaluation by me today.        Patient Data  Patient History  Past Medical History:   Diagnosis Date   . Esophageal reflux    . Hypertension          Past Surgical History:   Procedure Laterality Date   . HX CHOLECYSTECTOMY           Family Medical History:    None         Social History     Tobacco Use   . Smoking status: Never   . Smokeless tobacco: Never   Vaping Use   . Vaping Use: Never used   Substance Use Topics   . Alcohol use: Not Currently   . Drug use: Not Currently        The above documented section regarding past medical, past surgical, family, and social history (PMFSH) has been reviewed and considered and to the best of my knowledge represents a valid and accurate reflection of the patient's previous pertinent experiences documented by multiple providers and participants of the EMR.I cannot attest to all entries but do no recognize any gross inaccuracies as the data is a common field across all providers  Further history pertinent to the current encounter will be found as referenced       Physical Examination:    Vitals:    09/28/21 1614    BP: 132/76   Pulse: 60   Resp: 18   Temp: 35.8 C (96.5 F)   SpO2: 97%   Weight: 86.2 kg (190 lb)   Height: 1.727 m (5\' 8" )   BMI: 28.95        General:appropriate for age. in no acute distress.  HEENT:Atraumatic, Normocephalic.   Lungs:Nonlabored breathing with symmetric expansion  Heart:Regular wth respect to rate and rythmn. Palpable pedal pulses bilaterally  Abdomen:Soft. Nontender. Nondistended  Bowel sounds are present. No peritoneal signs  Skin:No Rashes. No ulcers. Skin warm  Psychiatric:Alert and oriented to person, place, and time. affect appropriate            Diagnosis:  ENCOUNTER DIAGNOSES     ICD-10-CM   1. 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 Pain in both lower extremities  M79.604    M79.605              Plan:  In office noninvasive arterial study reveals normal waveforms without evidence of significant arterial disease. Biliary symptoms have resolved status post cholecystectomy. Review of his records reveals recommendation for colonoscopy in 2025. All questions answered. Recommend treatment of risk factors including persistent use of his antihypertensive. Follow up here sooner if needed    This note may have been partially generated using [employer] system, and there may be some incorrect words, spellings, and punctuation that were not noted in checking the note before saving, though effort was made to avoid such errors.      Reinaldo Meeker MD FACS RVT  [facility]

## 2021-12-25 ENCOUNTER — Other Ambulatory Visit: Payer: Self-pay

## 2021-12-25 ENCOUNTER — Other Ambulatory Visit (INDEPENDENT_AMBULATORY_CARE_PROVIDER_SITE_OTHER): Payer: Self-pay

## 2021-12-25 ENCOUNTER — Other Ambulatory Visit: Payer: 59 | Attending: Occupational Medicine | Admitting: Occupational Medicine

## 2021-12-25 ENCOUNTER — Ambulatory Visit (INDEPENDENT_AMBULATORY_CARE_PROVIDER_SITE_OTHER): Payer: Self-pay | Admitting: Occupational Medicine

## 2021-12-25 ENCOUNTER — Other Ambulatory Visit (INDEPENDENT_AMBULATORY_CARE_PROVIDER_SITE_OTHER): Payer: Self-pay | Admitting: Occupational Medicine

## 2021-12-25 ENCOUNTER — Ambulatory Visit (INDEPENDENT_AMBULATORY_CARE_PROVIDER_SITE_OTHER): Payer: Self-pay

## 2021-12-25 VITALS — BP 138/80 | HR 58 | Temp 97.1°F | Resp 16 | Ht 67.36 in | Wt 190.6 lb

## 2021-12-25 DIAGNOSIS — Z0000 Encounter for general adult medical examination without abnormal findings: Secondary | ICD-10-CM

## 2021-12-25 LAB — URINALYSIS, MACROSCOPIC
BILIRUBIN: NEGATIVE mg/dL
BLOOD: NEGATIVE mg/dL
COLOR: NORMAL
GLUCOSE: NEGATIVE mg/dL
KETONES: NEGATIVE mg/dL
LEUKOCYTES: NEGATIVE WBCs/uL
NITRITE: NEGATIVE
PH: 7 (ref 5.0–8.0)
PROTEIN: NEGATIVE mg/dL
SPECIFIC GRAVITY: 1.009 (ref 1.005–1.030)
UROBILINOGEN: NEGATIVE mg/dL

## 2021-12-25 LAB — BASIC METABOLIC PANEL
ANION GAP: 9 mmol/L (ref 4–13)
BUN/CREA RATIO: 13 (ref 6–22)
BUN: 11 mg/dL (ref 8–25)
CALCIUM: 9.2 mg/dL (ref 8.6–10.2)
CHLORIDE: 107 mmol/L (ref 96–111)
CO2 TOTAL: 23 mmol/L (ref 22–30)
CREATININE: 0.85 mg/dL (ref 0.75–1.35)
ESTIMATED GFR: >90 mL/min/BSA (ref >=60)
GLUCOSE: 94 mg/dL (ref 65–125)
POTASSIUM: 4 mmol/L (ref 3.5–5.1)
SODIUM: 139 mmol/L (ref 136–145)

## 2021-12-25 LAB — CBC WITH DIFF
BASOPHIL #: <0.1 10*3/uL (ref <=0.20)
BASOPHIL %: 1 %
EOSINOPHIL #: 0.17 10*3/uL (ref <=0.50)
EOSINOPHIL %: 4 %
HCT: 39 % (ref 38.9–52.0)
HGB: 13 g/dL — ABNORMAL LOW (ref 13.4–17.5)
IMMATURE GRANULOCYTE #: <0.1 10*3/uL (ref <0.10)
IMMATURE GRANULOCYTE %: 0 % (ref 0–1)
LYMPHOCYTE #: 1.06 10*3/uL (ref 1.00–4.80)
LYMPHOCYTE %: 26 %
MCH: 29 pg (ref 26.0–32.0)
MCHC: 33.3 g/dL (ref 31.0–35.5)
MCV: 86.9 fL (ref 78.0–100.0)
MONOCYTE #: 0.4 10*3/uL (ref 0.20–1.10)
MONOCYTE %: 10 %
MPV: 9.2 fL (ref 8.7–12.5)
NEUTROPHIL #: 2.39 10*3/uL (ref 1.50–7.70)
NEUTROPHIL %: 59 %
PLATELETS: 169 10*3/uL (ref 150–400)
RBC: 4.49 10*6/uL — ABNORMAL LOW (ref 4.50–6.10)
RDW-CV: 12.9 % (ref 11.5–15.5)
WBC: 4.1 10*3/uL (ref 3.7–11.0)

## 2021-12-25 LAB — URINALYSIS, MICROSCOPIC
RBCS: 0 /hpf (ref <6.0)
WBCS: <1 /hpf (ref <4.0)

## 2021-12-25 NOTE — Progress Notes (Signed)
Patient here for work-related exam.  See documentation in paper chart.    Francene Boyers, MA 12/25/2021, 10:58    Lynnae January, MD

## 2021-12-25 NOTE — Progress Notes (Signed)
Patient here for work-related exam.  See documentation in paper chart.    Lynnae January, MD 12/25/2021, 10:26

## 2021-12-27 LAB — LEAD AND ZINC PROTOPORPHYRIN, OCCUPATIONAL EXPOSURE, BLOOD
LEAD (VENOUS), OSHA: 1.1 ug/dL (ref <40.0)
ZINC PROTOPORPHYRIN: <50 ug/dL (ref <100)

## 2022-01-03 ENCOUNTER — Telehealth (INDEPENDENT_AMBULATORY_CARE_PROVIDER_SITE_OTHER): Payer: Self-pay | Admitting: Occupational Medicine

## 2022-01-03 NOTE — Telephone Encounter (Signed)
Spoke with patient concerning abnormal Hemaglobin per Dr. Byrd Hesselbach instructions. Patient verbalized that he and his pcp are aware of the issue.

## 2022-01-03 NOTE — Result Encounter Note (Signed)
Please contact the patient and advise him that his hemoglobin has been slowly dropping for some time and is now in the mildly anemic range. He should follow up with his PCP regarding this result.

## 2022-11-08 IMAGING — MR MRI CERVICAL SPINE WITHOUT CONTRAST
4 of 5 series · 23 of 48 positions shown · IV contrast (gadolinium)
Comparison: Outside C-spine x-rays dated 07/18/2022.

﻿EXAM:  [id number]   MRI CERVICAL SPINE WITHOUT CONTRAST
INDICATION: Neck pain. Right shoulder and right arm pain. No history of trauma.
TECHNIQUE: Multiplanar, multisequential MRI of the C-spine was performed without gadolinium contrast.

[Series 5: T2 · sagittal · 3.0mm · 0.75mm/px · 8 of 13 slices shown (1 of 2)]
[im 1/13]
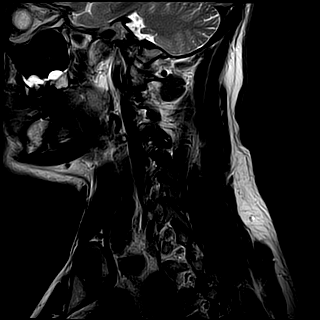
[im 2/13]
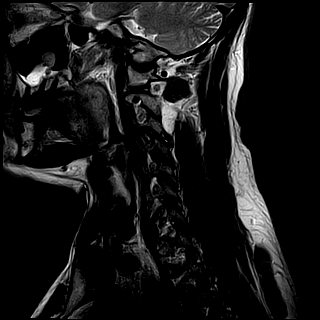
[im 4/13]
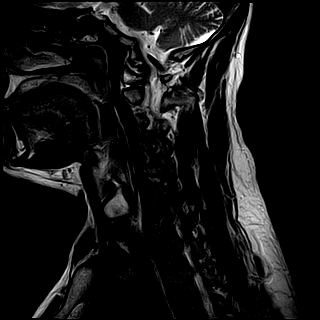
[im 6/13]
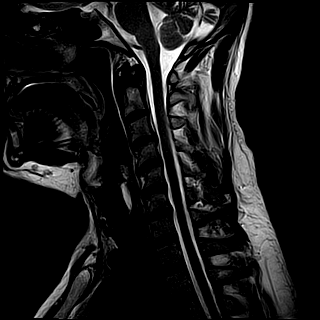
[im 7/13]
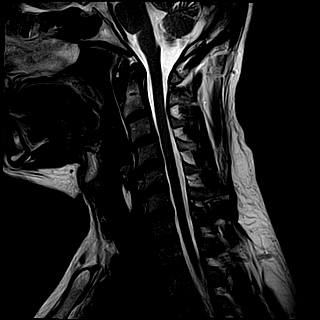
[im 9/13]
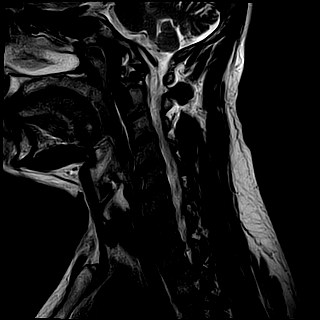
[im 11/13]
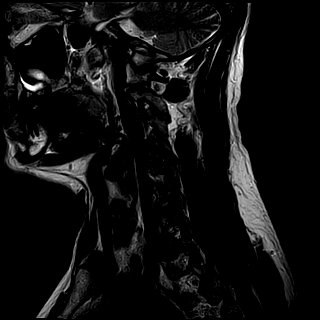
[im 13/13]
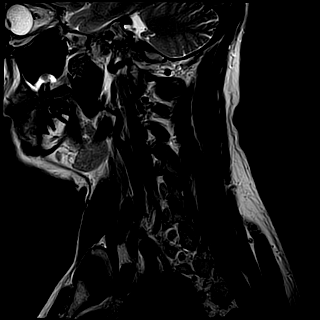

[Series 6: T1 · sagittal · 3.0mm · 0.47mm/px · 3 of 13 slices shown]
[im 2/13]
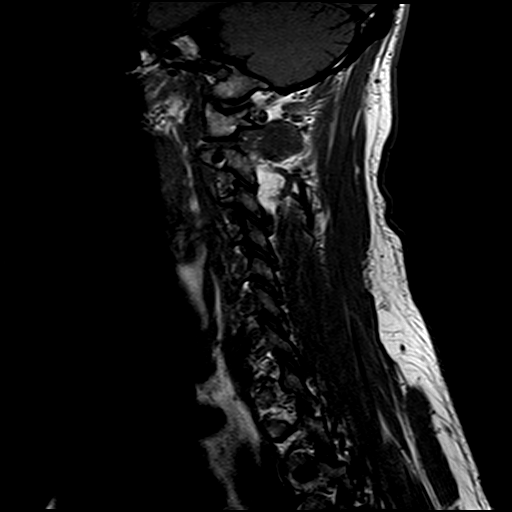
[im 7/13]
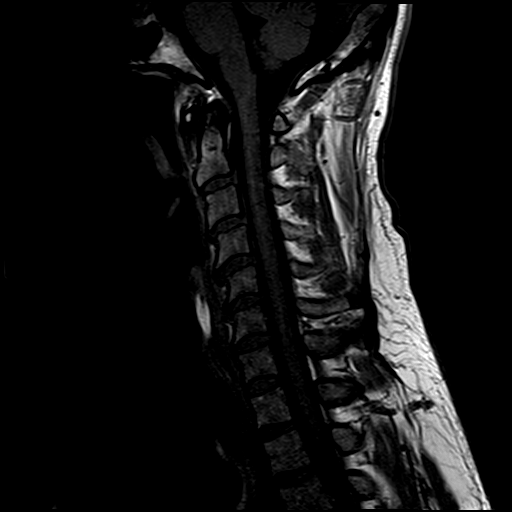
[im 11/13]
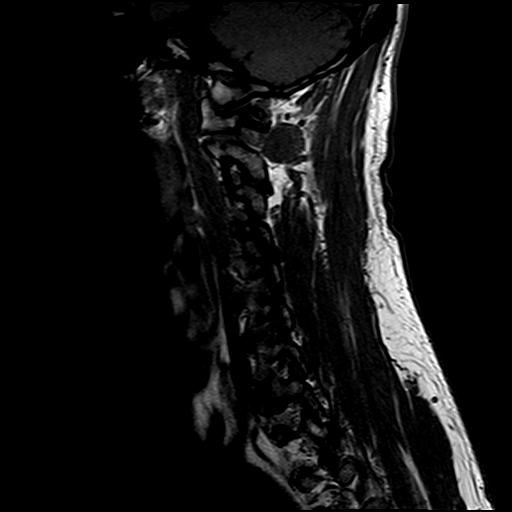

[Series 7: STIR · sagittal · 3.0mm · 0.47mm/px · 3 of 13 slices shown]
[im 2/13]
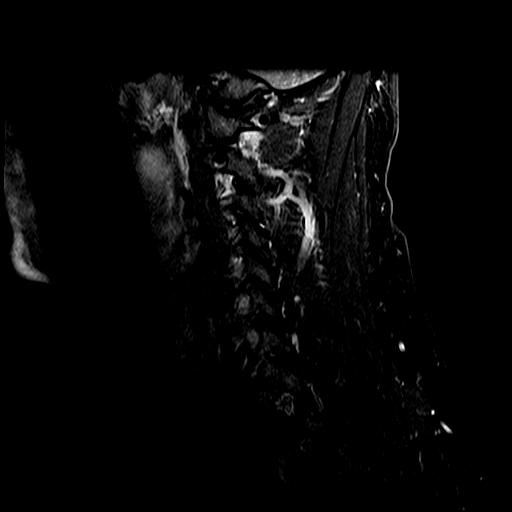
[im 7/13]
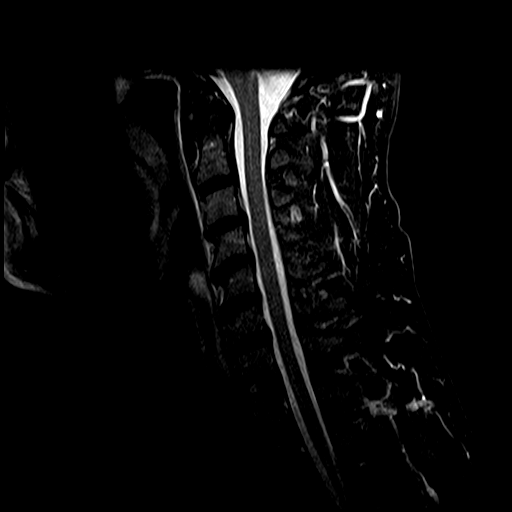
[im 11/13]
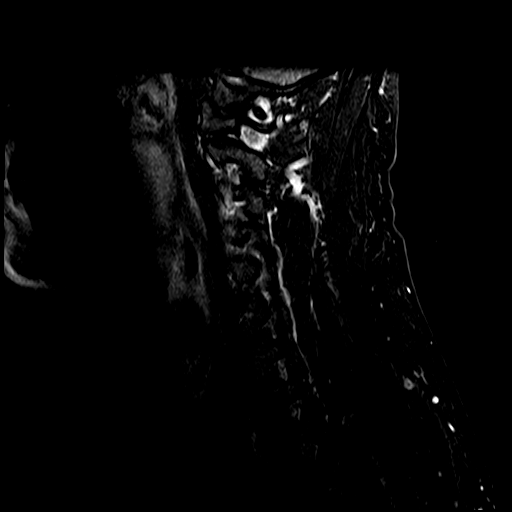

[Series 8: T2 · axial · 3.0mm · 0.39mm/px · z∈[-93,+14]mm · 9 of 18 slices shown (2 of 2)]
[im 1/18]
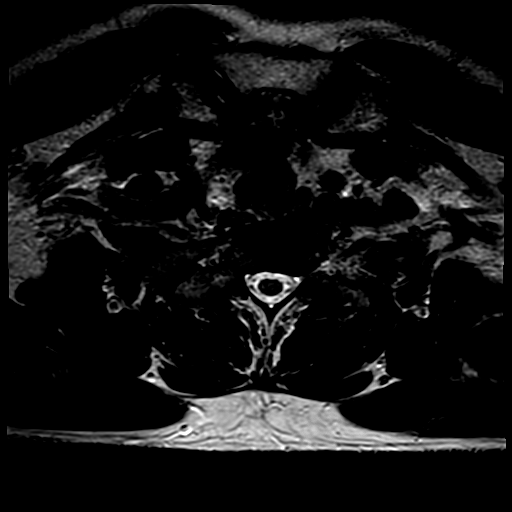
[im 4/18]
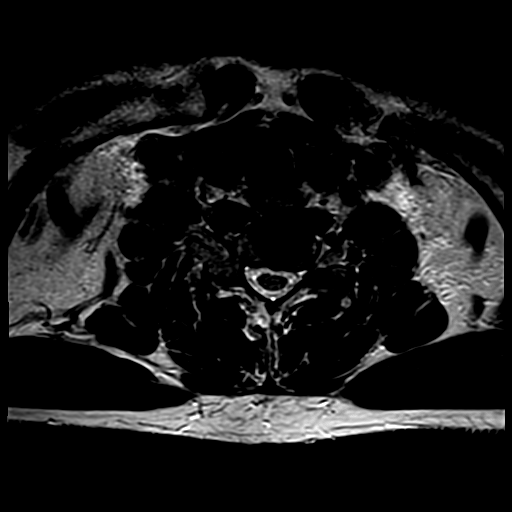
[im 5/18]
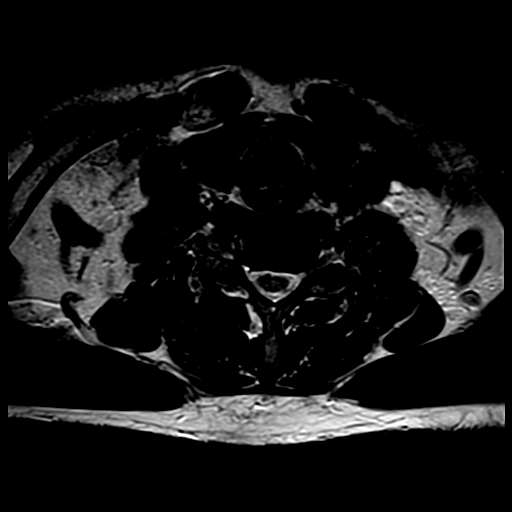
[im 8/18]
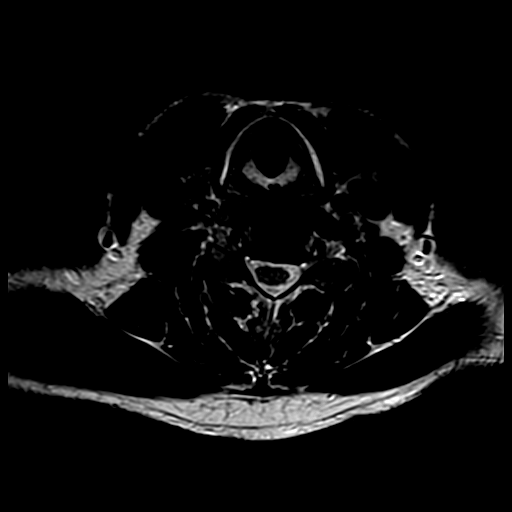
[im 10/18]
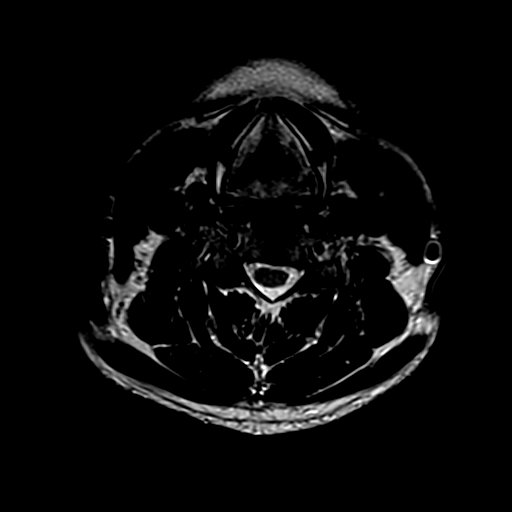
[im 13/18]
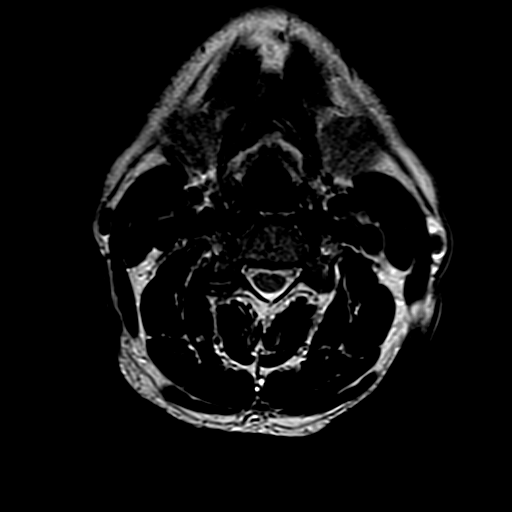
[im 14/18]
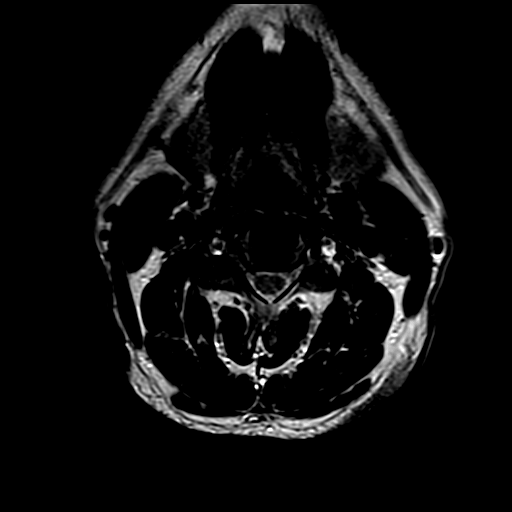
[im 16/18]
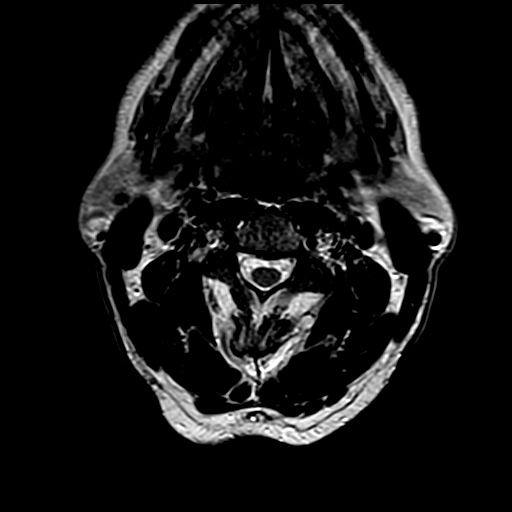
[im 18/18]
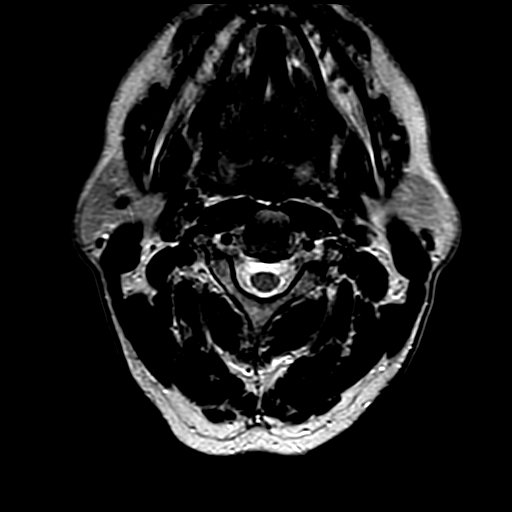

[23 of 48 positions shown; findings below may reference images not displayed]

FINDINGS: No acute bony lesions of cervical vertebrae.  Structures at the foramen magnum are normal in the sagittal view. 

At C2-3 level, no focal disc lesions are seen. 

At C3-4 level, bulging annulus due to degenerative disc disease and annulus tear in the midline are noted causing mild compromise of thecal sac in the midline with AP diameter measuring 10.5 mm. Asymmetric bulging annulus is causing significant right foraminal narrowing at this level. 

At C4-5 level, degenerative disc disease with bulging annulus is causing mild to moderate biforaminal narrowing.

At C5-6 level, degenerative disc changes are causing mild right foraminal narrowing.

At C6-7 level, degenerative disc disease with bulging annulus causing moderate right foraminal narrowing.  Minimal compromise of thecal sac in the midline with AP diameter measuring 10 mm.

C7-T1 disc is normal.  Cervical spinal cord shows no focal lesions. Paravertebral soft tissues are unremarkable.
IMPRESSION: 1. No acute bony lesions of cervical vertebrae. 

2. Mild-to-moderate degenerative disc changes at multiple levels as described above in detail at each level.

3. No focal lesions of cervical spinal cord.

## 2022-11-22 IMAGING — MR MRI SHOULDER RT W/O CONTRAST
6 series · 38 of 40 positions shown · IV contrast (gadolinium)
Comparison: MRI C-spine dated 11/08/2022.

﻿EXAM:  [id number]   MRI SHOULDER RT W/O CONTRAST
INDICATION: 57-year-old with history of right shoulder pain. Neck pain. Right arm pain.  No history of previous shoulder surgery.
TECHNIQUE: Multiplanar, multisequential MRI of the right shoulder was performed without gadolinium contrast.

[Series 6: T1 · oblique · right · 3.5mm · 0.33mm/px · 7 of 18 slices shown]
[im 1/18]
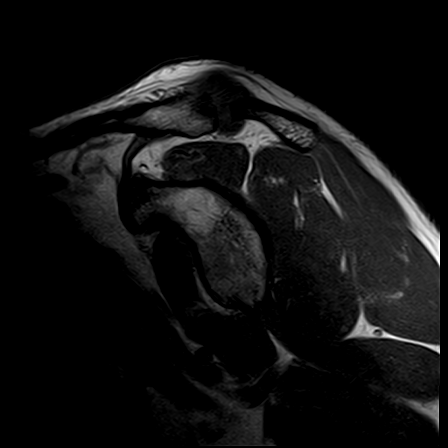
[im 3/18]
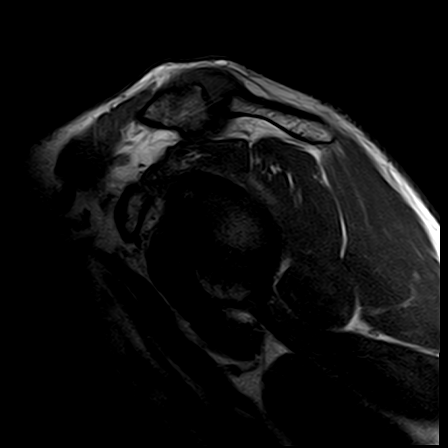
[im 6/18]
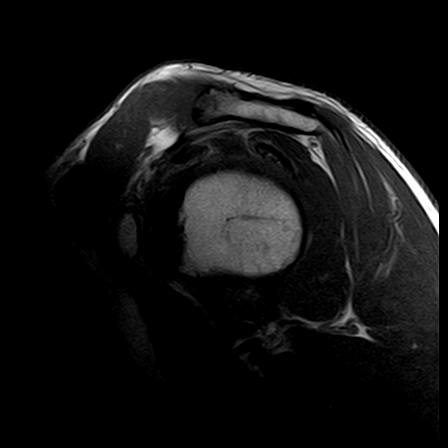
[im 9/18]
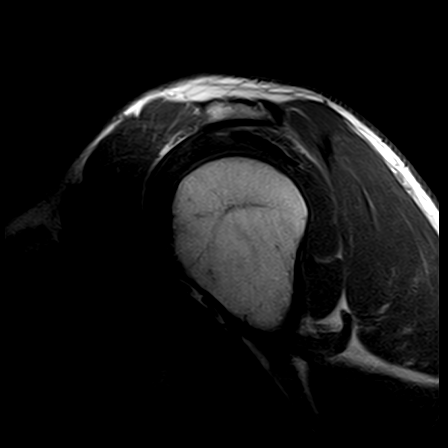
[im 12/18]
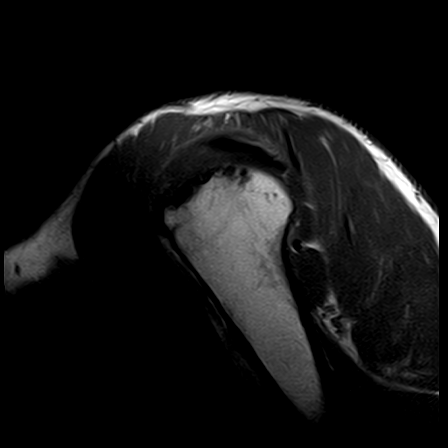
[im 15/18]
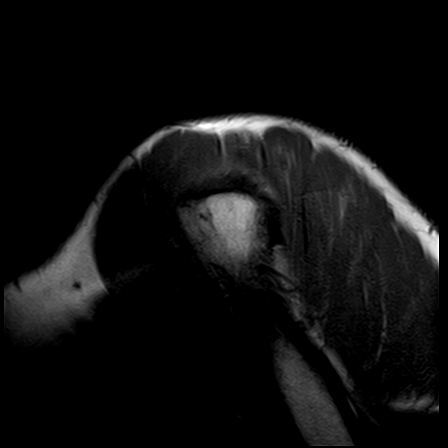
[im 18/18]
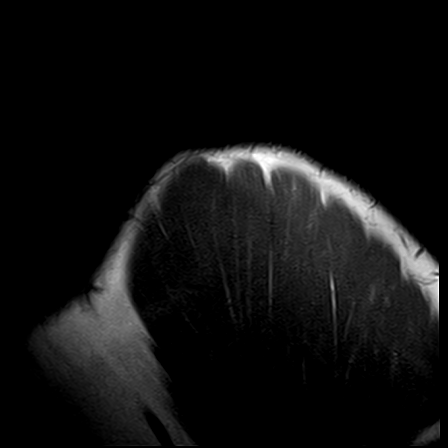

[Series 7: STIR · oblique · right · 4.0mm · 0.47mm/px · 7 of 18 slices shown (1 of 2)]
[im 1/18]
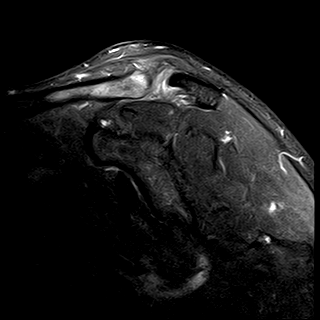
[im 3/18]
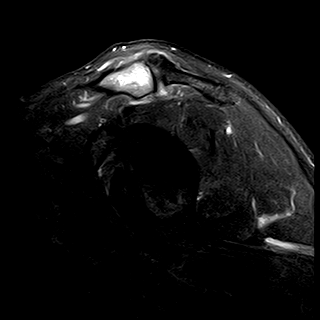
[im 6/18]
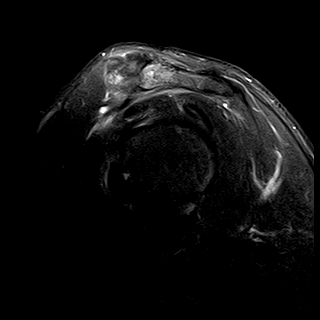
[im 9/18]
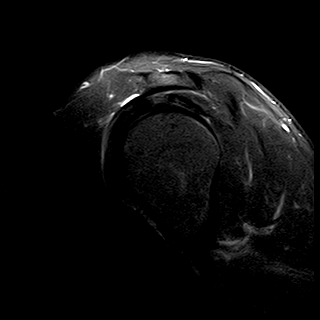
[im 12/18]
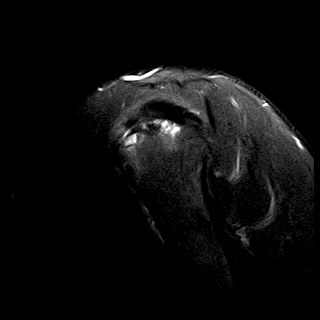
[im 15/18]
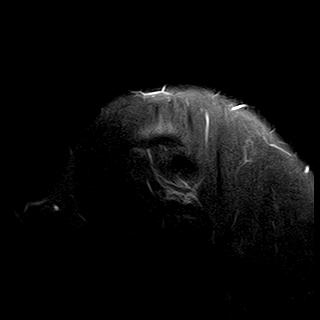
[im 18/18]
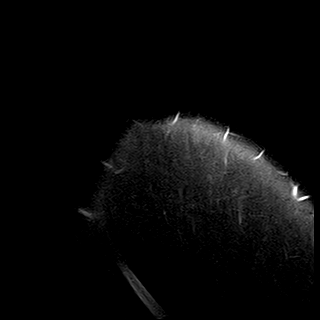

[Series 8: PD fat-sat · axial · right · 4.0mm · 0.50mm/px · z∈[-40,+37]mm · 6 of 18 slices shown (1 of 2)]
[im 1/18]
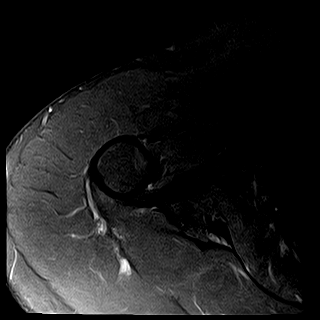
[im 4/18]
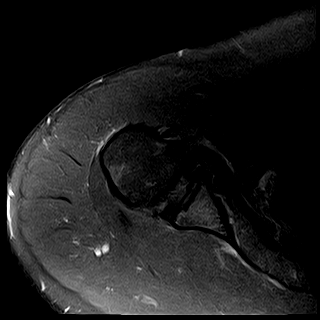
[im 7/18]
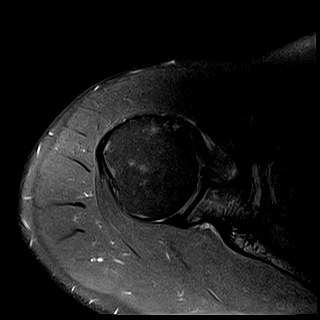
[im 11/18]
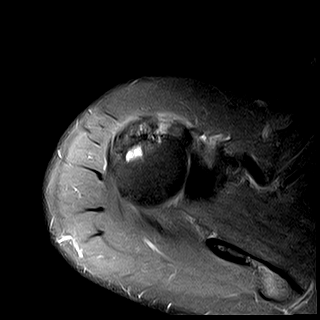
[im 14/18]
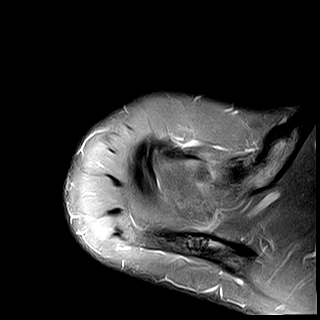
[im 18/18]
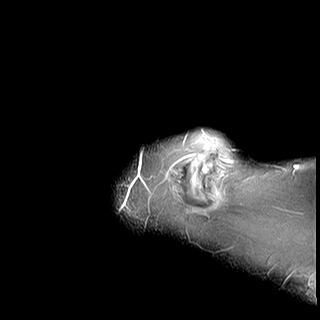

[Series 9: T2 fat-sat · axial · right · 4.0mm · 0.42mm/px · z∈[-53,+50]mm · 8 of 24 slices shown]
[im 1/24]
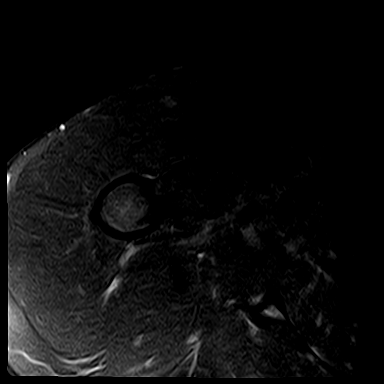
[im 4/24]
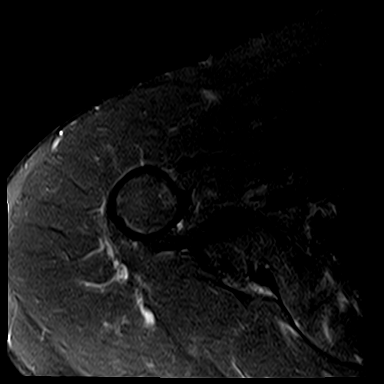
[im 7/24]
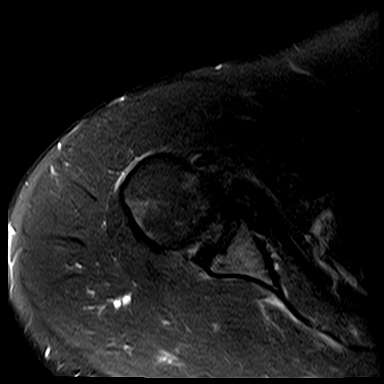
[im 10/24]
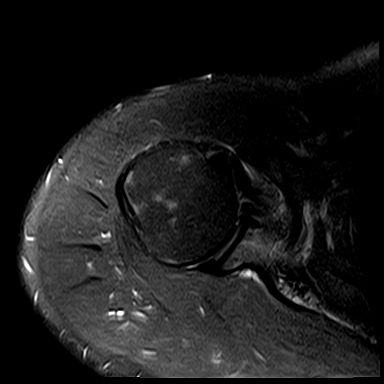
[im 14/24]
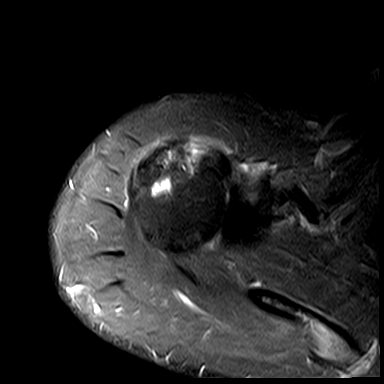
[im 17/24]
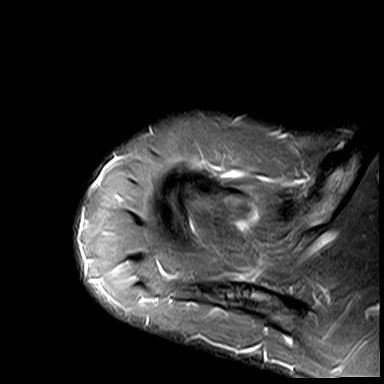
[im 20/24]
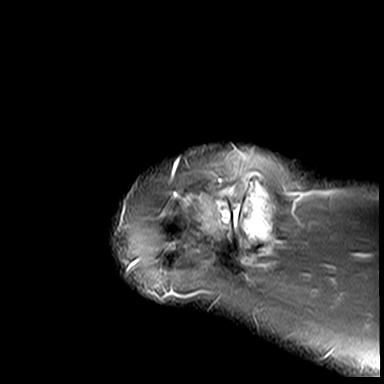
[im 24/24]
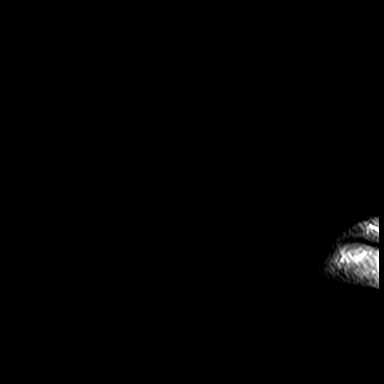

[Series 10: PD fat-sat · oblique · right · 3.5mm · 0.47mm/px · 6 of 18 slices shown (2 of 2)]
[im 1/18]
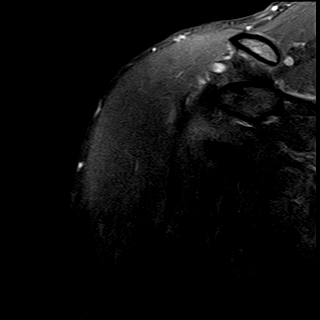
[im 4/18]
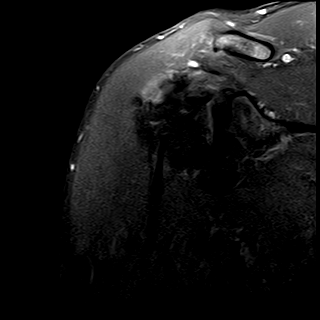
[im 7/18]
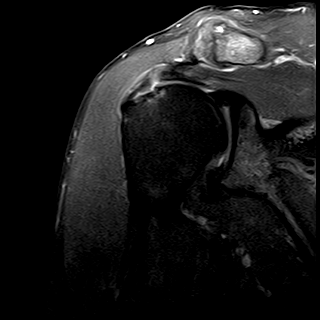
[im 11/18]
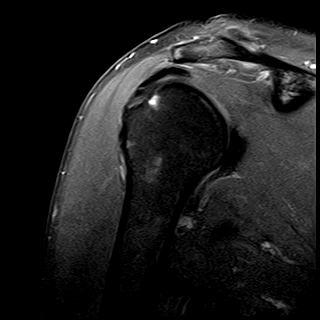
[im 14/18]
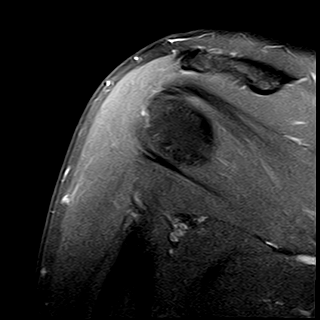
[im 18/18]
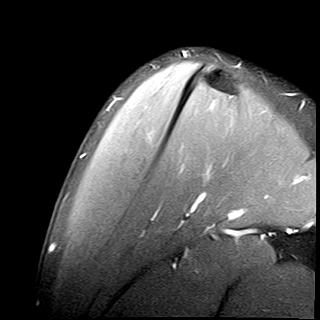

[Series 11: STIR · oblique · right · 3.5mm · 0.47mm/px · 4 of 18 slices shown (2 of 2)]
[im 1/18]
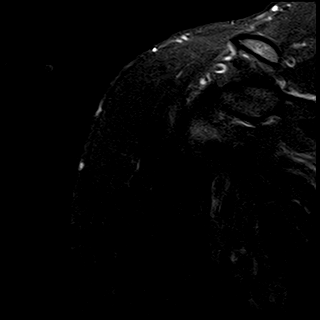
[im 4/18]
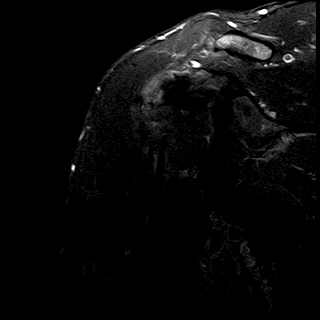
[im 7/18]
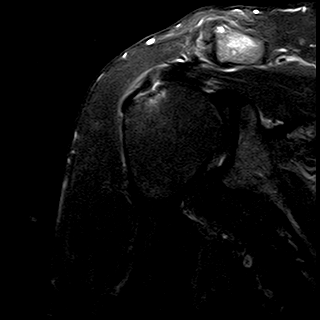
[im 11/18]
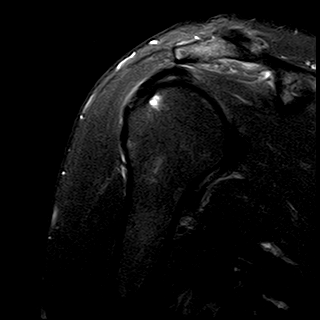

[38 of 40 positions shown; findings below may reference images not displayed]

FINDINGS: No acute bony lesions are seen at the right shoulder.  Degenerative arthritis of the AC joint and hook shaped acromion process are moderately impinging on the subacromion space and the supraspinatus.

Supraspinatus tendinitis is noted with small intrasubstance partial-thickness tear at the insertion of supraspinatus.  Multiple small ganglion cysts are noted in the proximal humerus at the attachment of the supraspinatus.  No evidence of full-thickness tear of the rotator cuff is seen.

Glenoid labrum shows no acute findings. Long head of biceps tendon is intact.
IMPRESSION: 1. No acute bone changes at the right shoulder. 

2. Hook shaped acromion process and degenerative changes of AC joint are mildly impinging on the subacromion space and the supraspinatus.

3. Significant inflammatory changes of distal supraspinatus tendon are noted along with small intrasubstance partial-thickness tear at the insertion of the distal supraspinatus.  Small ganglion cysts are noted at the insertion of supraspinatus into the proximal humerus.

## 2023-08-27 ENCOUNTER — Encounter (INDEPENDENT_AMBULATORY_CARE_PROVIDER_SITE_OTHER): Payer: Self-pay | Admitting: Surgery
